# Patient Record
Sex: Female | Born: 2005 | Race: Black or African American | Hispanic: No | Marital: Single | Smoking: Never smoker
Health system: Southern US, Community
[De-identification: ages and names within clinical notes are randomized; demographics above are authoritative.]

## PROBLEM LIST (undated history)

## (undated) DIAGNOSIS — T7840XA Allergy, unspecified, initial encounter: Secondary | ICD-10-CM

## (undated) HISTORY — DX: Allergy, unspecified, initial encounter: T78.40XA

## (undated) HISTORY — PX: ADENOIDECTOMY: SUR15

---
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
## 2023-04-18 ENCOUNTER — Other Ambulatory Visit: Payer: Self-pay

## 2023-04-18 ENCOUNTER — Emergency Department
Admission: EM | Admit: 2023-04-18 | Discharge: 2023-04-18 | Disposition: A | Discharge status: Home / Self Care | Payer: BLUE CROSS/BLUE SHIELD | Attending: Emergency Medicine | Admitting: Emergency Medicine

## 2023-04-18 DIAGNOSIS — R112 Nausea with vomiting, unspecified: Secondary | ICD-10-CM

## 2023-04-18 DIAGNOSIS — N946 Dysmenorrhea, unspecified: Secondary | ICD-10-CM | POA: Diagnosis present

## 2023-04-18 LAB — COMPREHENSIVE METABOLIC PANEL
ALT: 12 U/L (ref 0–44)
AST: 19 U/L (ref 15–41)
Albumin: 4.1 g/dL (ref 3.5–5.0)
Alkaline Phosphatase: 60 U/L (ref 38–126)
Anion gap: 7 (ref 5–15)
BUN: 11 mg/dL (ref 6–20)
CO2: 23 mmol/L (ref 22–32)
Calcium: 9 mg/dL (ref 8.9–10.3)
Chloride: 110 mmol/L (ref 98–111)
Creatinine, Ser: 0.62 mg/dL (ref 0.44–1.00)
GFR, Estimated: >60 mL/min (ref >60)
Glucose, Bld: 118 mg/dL — ABNORMAL HIGH (ref 70–99)
Potassium: 3.9 mmol/L (ref 3.5–5.1)
Sodium: 140 mmol/L (ref 135–145)
Total Bilirubin: 0.7 mg/dL (ref 0.3–1.2)
Total Protein: 8 g/dL (ref 6.5–8.1)

## 2023-04-18 LAB — URINALYSIS, ROUTINE W REFLEX MICROSCOPIC
Bacteria, UA: NONE SEEN
Bilirubin Urine: NEGATIVE
Glucose, UA: NEGATIVE mg/dL
Ketones, ur: 5 mg/dL — AB
Nitrite: NEGATIVE
Protein, ur: 100 mg/dL — AB
RBC / HPF: >50 RBC/hpf (ref 0–5)
Specific Gravity, Urine: 1.029 (ref 1.005–1.030)
pH: 8 (ref 5.0–8.0)

## 2023-04-18 LAB — CBC
HCT: 31.5 % — ABNORMAL LOW (ref 36.0–46.0)
Hemoglobin: 9.2 g/dL — ABNORMAL LOW (ref 12.0–15.0)
MCH: 19.1 pg — ABNORMAL LOW (ref 26.0–34.0)
MCHC: 29.2 g/dL — ABNORMAL LOW (ref 30.0–36.0)
MCV: 65.4 fL — ABNORMAL LOW (ref 80.0–100.0)
Platelets: 611 10*3/uL — ABNORMAL HIGH (ref 150–400)
RBC: 4.82 MIL/uL (ref 3.87–5.11)
RDW: 20.5 % — ABNORMAL HIGH (ref 11.5–15.5)
WBC: 9.5 10*3/uL (ref 4.0–10.5)
nRBC: 0 % (ref 0.0–0.2)

## 2023-04-18 LAB — LIPASE, BLOOD: Lipase: 27 U/L (ref 11–51)

## 2023-04-18 LAB — POC URINE PREG, ED: Preg Test, Ur: NEGATIVE

## 2023-04-18 MED ORDER — ONDANSETRON 4 MG PO TBDP
4.0000 mg | ORAL_TABLET | Freq: Once | ORAL | Status: AC
  Administered 2023-04-18: 4 mg via ORAL
  Filled 2023-04-18: qty 1

## 2023-04-18 MED ORDER — ONDANSETRON 4 MG PO TBDP: 4.0000 mg | ORAL_TABLET | Freq: Three times a day (TID) | ORAL | 0 refills | Status: AC | PRN

## 2023-04-18 MED ORDER — KETOROLAC TROMETHAMINE 15 MG/ML IJ SOLN
15.0000 mg | Freq: Once | INTRAMUSCULAR | Status: AC
  Administered 2023-04-18: 15 mg via INTRAMUSCULAR
  Filled 2023-04-18: qty 1

## 2023-04-18 NOTE — ED Triage Notes (Signed)
Pt comes with c/o bad menstrual cramps and vomiting that started this morning.

## 2023-04-18 NOTE — ED Provider Notes (Signed)
[facility] Provider Note    Event Date/Time   First MD Initiated Contact with Patient 04/18/23 1056     (approximate)   History   menstrual cramps   HPI  Kim Preston is a 17 y.o. female with a past medical history of being overweight who presents today for evaluation of painful menstrual cramps.  Patient reports that her period began yesterday.  She reports that her cycle is regular.  She does not feel that it is any heavier than usual.  She does not feel that it is any more painful than usual.  She reports that every other month she has associated nausea and vomiting and today she has associated nausea and vomiting.  She reports that it feels exactly the same as her previous menstrual cycles, however she is requesting nausea medicine.  She reports that her nausea has improved significantly since arriving to the emergency department.  No fevers or chills.  No other vaginal discharge.  No dizziness or shortness of breath.  Patient Active Problem List   Diagnosis Date Noted   Vitamin D insufficiency 03/16/2016   Blood pressure elevated without history of HTN 03/09/2016   BMI (body mass index), pediatric, > 99% for age 12/08/2015   Muscular deconditioning 03/09/2016   Prediabetes 03/09/2016   Allergic rhinitis due to allergen 10/31/2013          Physical Exam   Triage Vital Signs: ED Triage Vitals  Encounter Vitals Group     BP 04/18/23 1029 123/86     Systolic BP Percentile --      Diastolic BP Percentile --      Pulse Rate 04/18/23 1029 88     Resp 04/18/23 1029 19     Temp 04/18/23 1029 98 F (36.7 C)     Temp src --      SpO2 04/18/23 1029 100 %     Weight --      Height --      Head Circumference --      Peak Flow --      Pain Score 04/18/23 1023 7     Pain Loc --      Pain Education --      Exclude from Growth Chart --     Most recent vital signs: Vitals:   04/18/23 1029  BP: 123/86  Pulse: 88  Resp: 19  Temp: 98 F (36.7  C)  SpO2: 100%    Physical Exam Vitals and nursing note reviewed.  Constitutional:      General: Awake and alert. No acute distress.    Appearance: Normal appearance. The patient is normal weight.  HENT:     Head: Normocephalic and atraumatic.     Mouth: Mucous membranes are moist.  Eyes:     General: PERRL. Normal EOMs        Right eye: No discharge.        Left eye: No discharge.     Conjunctiva/sclera: Conjunctivae normal.  Cardiovascular:     Rate and Rhythm: Normal rate and regular rhythm.     Pulses: Normal pulses.     Heart sounds: Normal heart sounds Pulmonary:     Effort: Pulmonary effort is normal. No respiratory distress.     Breath sounds: Normal breath sounds.  Abdominal:     Abdomen is soft. There is no abdominal tenderness. No rebound or guarding. No distention. Musculoskeletal:        General: No swelling. Normal range of  motion.     Cervical back: Normal range of motion and neck supple.  Skin:    General: Skin is warm and dry.     Capillary Refill: Capillary refill takes less than 2 seconds.     Findings: No rash.  Neurological:     Mental Status: The patient is awake and alert.      ED Results / Procedures / Treatments   Labs (all labs ordered are listed, but only abnormal results are displayed) Labs Reviewed  COMPREHENSIVE METABOLIC PANEL - Abnormal; Notable for the following components:      Result Value   Glucose, Bld 118 (*)    All other components within normal limits  CBC - Abnormal; Notable for the following components:   Hemoglobin 9.2 (*)    HCT 31.5 (*)    MCV 65.4 (*)    MCH 19.1 (*)    MCHC 29.2 (*)    RDW 20.5 (*)    Platelets 611 (*)    All other components within normal limits  URINALYSIS, ROUTINE W REFLEX MICROSCOPIC - Abnormal; Notable for the following components:   Color, Urine YELLOW (*)    APPearance CLOUDY (*)    Hgb urine dipstick MODERATE (*)    Ketones, ur 5 (*)    Protein, ur 100 (*)    Leukocytes,Ua TRACE (*)     All other components within normal limits  LIPASE, BLOOD  POC URINE PREG, ED     EKG     RADIOLOGY I independently reviewed and interpreted imaging and agree with radiologists findings.     PROCEDURES:  Critical Care performed:   Procedures   MEDICATIONS ORDERED IN ED: Medications  ketorolac (TORADOL) 15 MG/ML injection 15 mg (15 mg Intramuscular Given 04/18/23 1137)  ondansetron (ZOFRAN-ODT) disintegrating tablet 4 mg (4 mg Oral Given 04/18/23 1137)     IMPRESSION / MDM / ASSESSMENT AND PLAN / ED COURSE  I reviewed the triage vital signs and the nursing notes.   Differential diagnosis includes, but is not limited to, heavy menses, gastroenteritis, urinary tract infection, fibroids.  Patient is awake and alert, hemodynamically stable and afebrile.  She has absolutely no abdominal tenderness, or complaints of adnexal pain, I do not suspect ovarian torsion.  Her cramps are suprapubic, no adnexal pain or tenderness.  Urine pregnancy is negative.  She is slightly anemic, though has been low in the past as well.  Her urinalysis has blood as expected, but no evidence of infection.  She was treated symptomatically with Zofran and Toradol with significant improvement of her symptoms.  She is able to tolerate p.o. Recommended that she follow up with OBGYN to discuss hormones for stabilizing cycles. Also recommend starting iron supplement.  She was also given a prescription for Zofran to use at home, though advised not to take more than prescribed due to risk of arrhythmia if she takes it in excess.  Patient and mother understand and agree with plan. Discharged in stable condition.   Patient's presentation is most consistent with acute complicated illness / injury requiring diagnostic workup.    FINAL CLINICAL IMPRESSION(S) / ED DIAGNOSES   Final diagnoses:  Menses painful  Nausea and vomiting, unspecified vomiting type     Rx / DC Orders   ED Discharge Orders           Ordered    ondansetron (ZOFRAN-ODT) 4 MG disintegrating tablet  Every 8 hours PRN        04/18/23 1150  Note:  This document was prepared using Dragon voice recognition software and may include unintentional dictation errors.   Jackelyn Hoehn, PA-C 04/18/23 1330    Janith Lima, MD 04/19/23 [phone]

## 2023-04-18 NOTE — Discharge Instructions (Addendum)
You may take the medication as prescribed to help with your symptoms.  Please also take iron as you are slightly anemic.  Please return for any new, worsening, or change in symptoms or other concerns.  You may follow-up with OB/GYN as we discussed.  It was a pleasure caring for you today.

## 2023-09-21 IMAGING — US US PELVIS COMPLETE
1 series · 14 of 25 positions shown · non-contrast
Comparison: None.

CLINICAL DATA: Pelvic pain since this morning

EXAM:
TRANSABDOMINAL ULTRASOUND OF PELVIS
DOPPLER ULTRASOUND OF OVARIES
TECHNIQUE: Transabdominal ultrasound examination of the pelvis was performed
including evaluation of the uterus, ovaries, adnexal regions, and
pelvic cul-de-sac.
Color and duplex Doppler ultrasound was utilized to evaluate blood
flow to the ovaries.

[Series 1: gyn us · 14 of 50 slices shown]
[im 1/50]
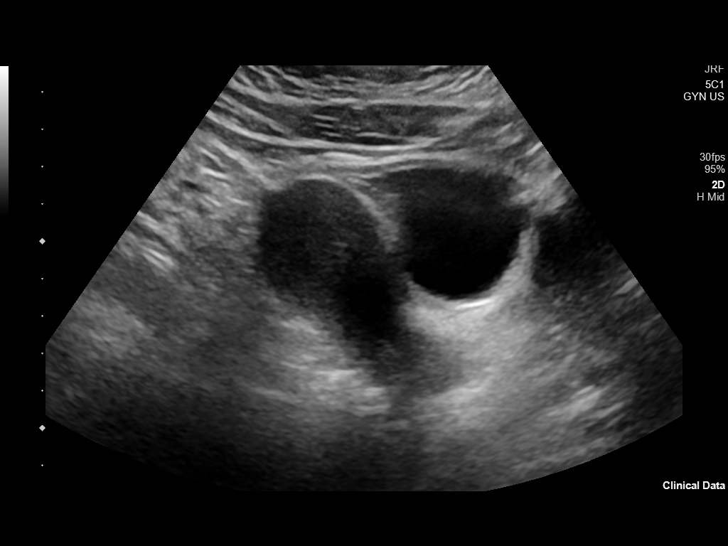
[im 5/50]
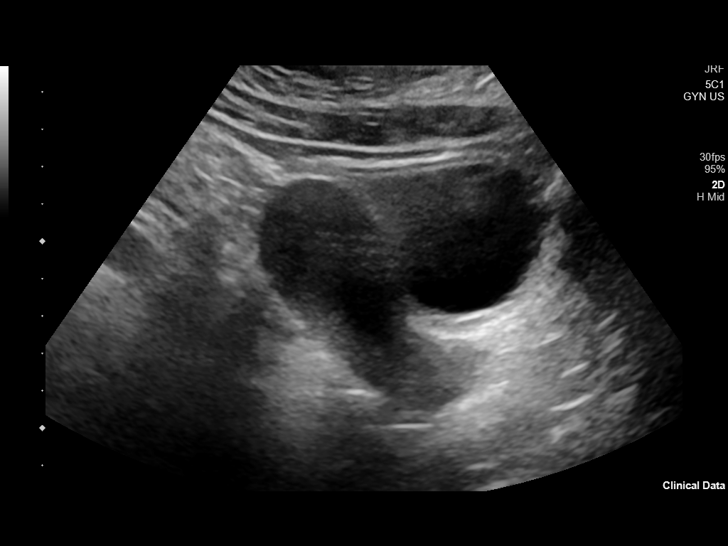
[im 9/50]
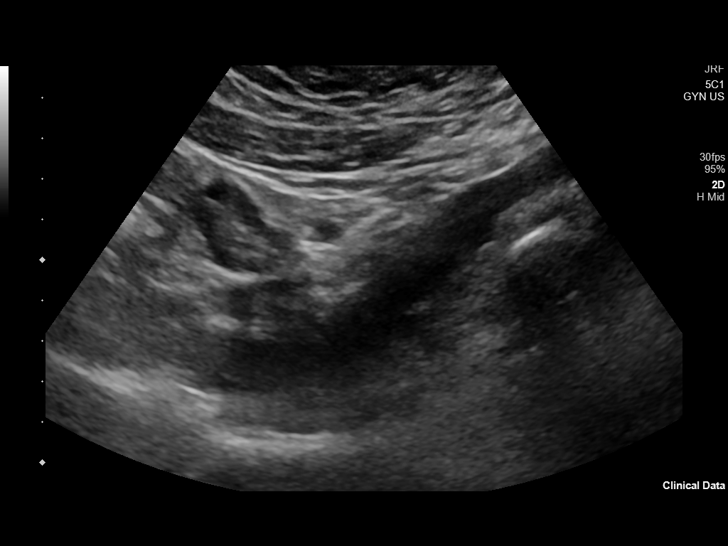
[im 13/50]
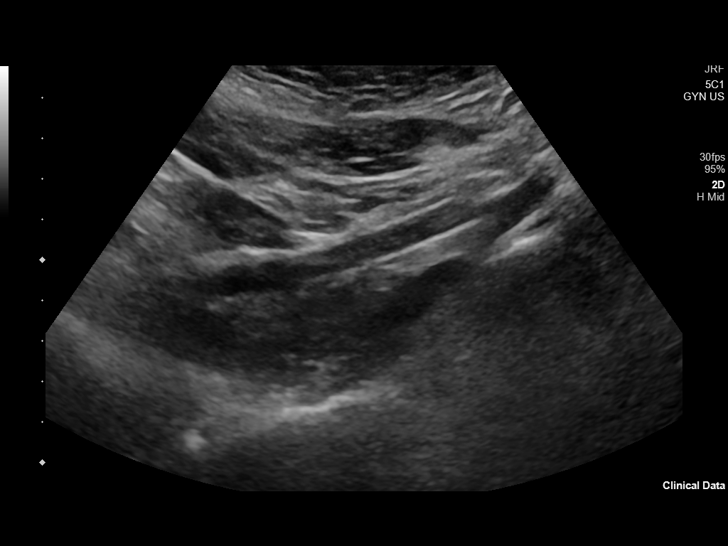
[im 17/50]
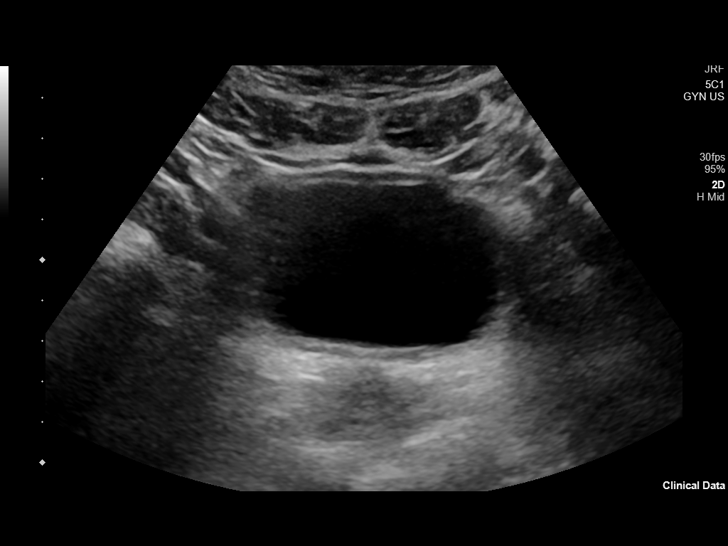
[im 19/50]
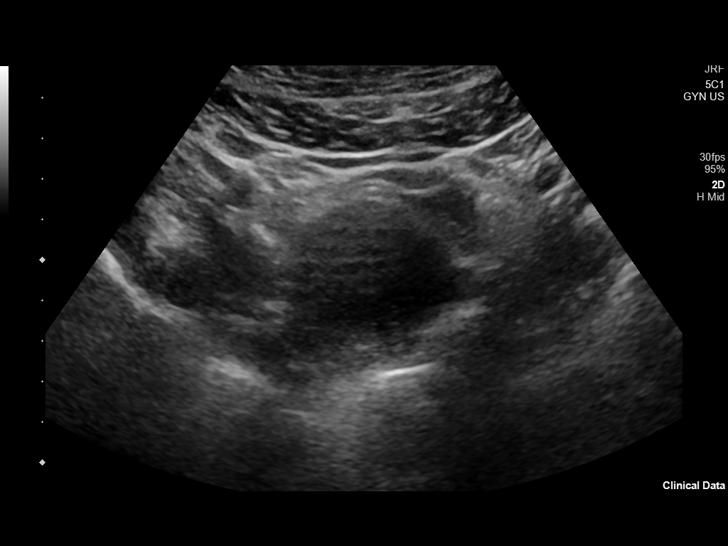
[im 23/50]
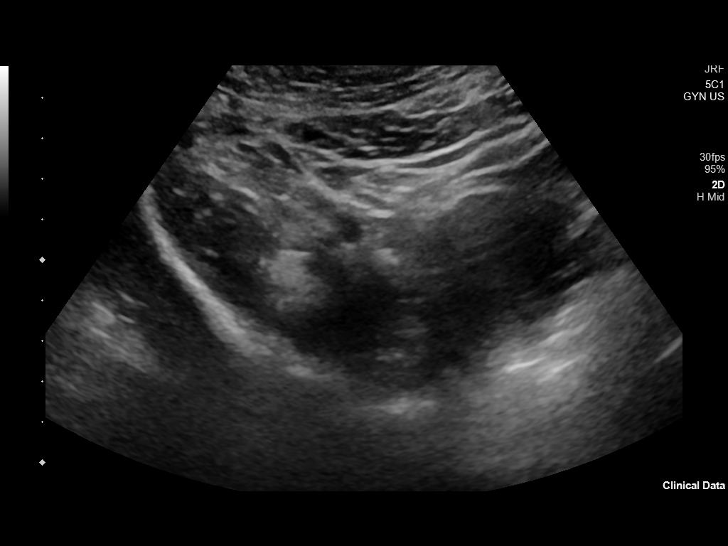
[im 27/50]
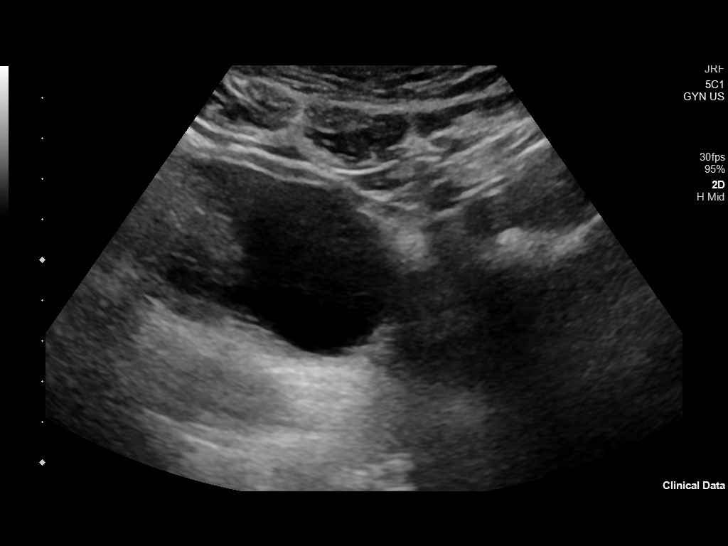
[im 31/50]
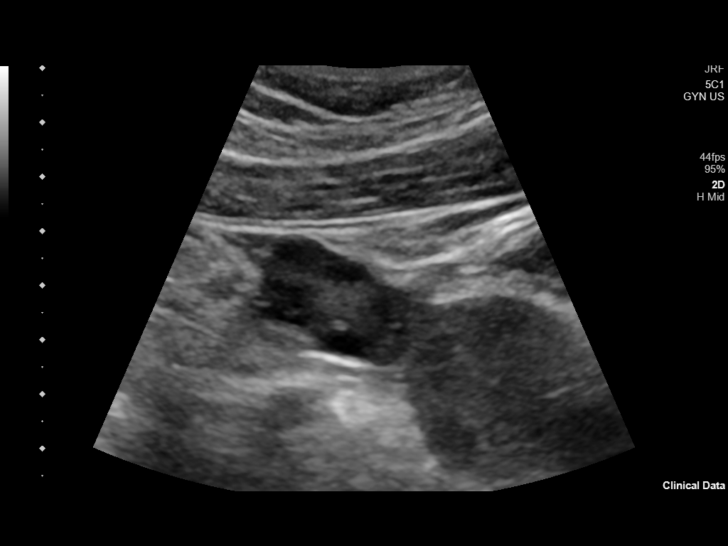
[im 33/50]
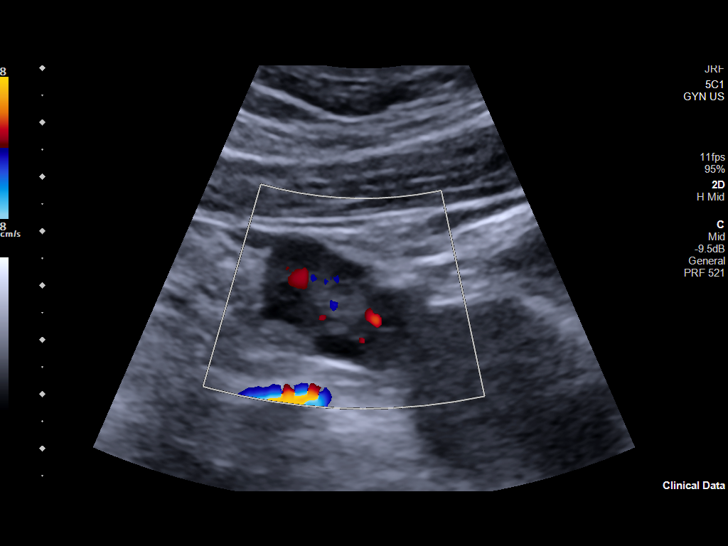
[im 37/50]
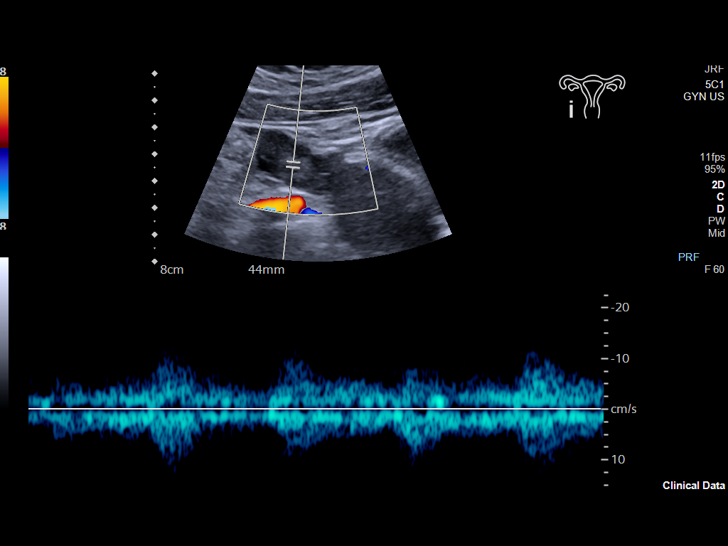
[im 41/50]
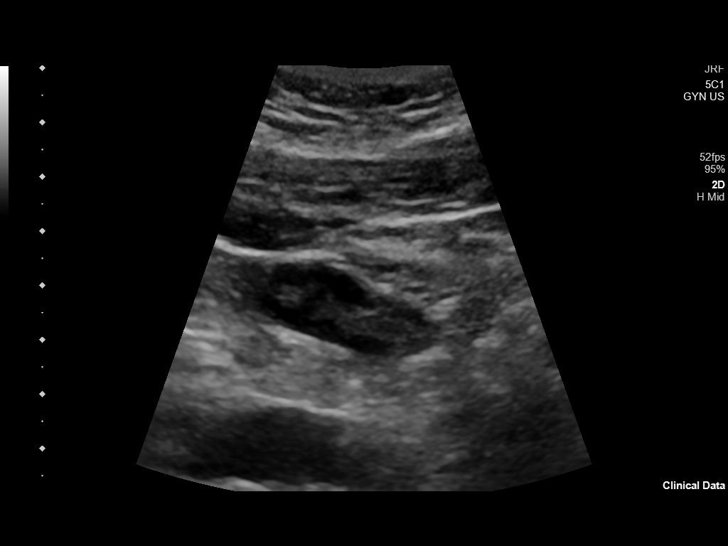
[im 45/50]
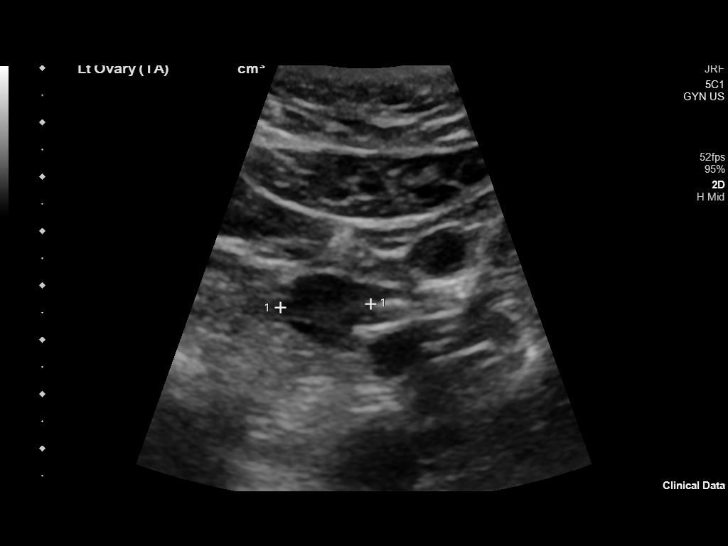
[im 50/50]
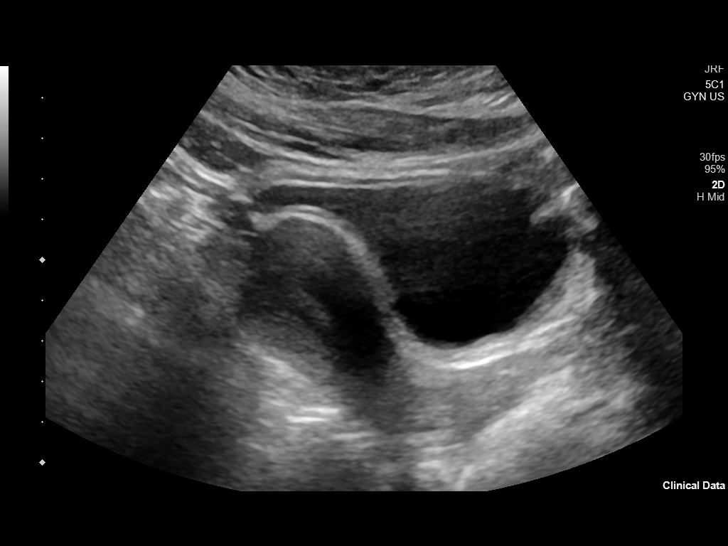

[14 of 25 positions shown; findings below may reference images not displayed]

FINDINGS: Uterus

Measurements: 6.9 cm x 3.5 cm x 3.9 cm = volume: 49 mL. No fibroids
or other mass visualized.

Endometrium

Thickness: 6.  No focal abnormality visualized.

Right ovary

Measurements: 3.2 cm x 1.6 cm x 2.3 cm = volume: 6 mL. Normal
appearance/no adnexal mass.

Left ovary

Measurements: 3.4 cm x 1.5 cm x 1.7 cm = volume: 4 mL. Normal
appearance/no adnexal mass.

Pulsed Doppler evaluation demonstrates normal low-resistance
arterial and venous waveforms in both ovaries.

Other: There is no free fluid.
IMPRESSION: Normal pelvic ultrasound.
